# Patient Record
Sex: Female | Born: 1935 | Race: White | Hispanic: No | Smoking: Never smoker
Health system: Southern US, Community
[De-identification: ages and names within clinical notes are randomized; demographics above are authoritative.]

## PROBLEM LIST (undated history)

## (undated) DIAGNOSIS — I4891 Unspecified atrial fibrillation: Secondary | ICD-10-CM

---

## 2016-02-23 ENCOUNTER — Emergency Department (HOSPITAL_BASED_OUTPATIENT_CLINIC_OR_DEPARTMENT_OTHER)
Admission: EM | Admit: 2016-02-23 | Discharge: 2016-02-23 | Disposition: A | Discharge status: Home / Self Care | Payer: Medicare Other | Attending: Emergency Medicine | Admitting: Emergency Medicine

## 2016-02-23 ENCOUNTER — Encounter (HOSPITAL_BASED_OUTPATIENT_CLINIC_OR_DEPARTMENT_OTHER): Payer: Self-pay | Admitting: *Deleted

## 2016-02-23 ENCOUNTER — Emergency Department (HOSPITAL_BASED_OUTPATIENT_CLINIC_OR_DEPARTMENT_OTHER): Payer: Medicare Other

## 2016-02-23 DIAGNOSIS — Y9389 Activity, other specified: Secondary | ICD-10-CM | POA: Diagnosis not present

## 2016-02-23 DIAGNOSIS — Z880 Allergy status to penicillin: Secondary | ICD-10-CM | POA: Insufficient documentation

## 2016-02-23 DIAGNOSIS — Z79899 Other long term (current) drug therapy: Secondary | ICD-10-CM | POA: Diagnosis not present

## 2016-02-23 DIAGNOSIS — W1839XA Other fall on same level, initial encounter: Secondary | ICD-10-CM | POA: Insufficient documentation

## 2016-02-23 DIAGNOSIS — Y92129 Unspecified place in nursing home as the place of occurrence of the external cause: Secondary | ICD-10-CM | POA: Diagnosis not present

## 2016-02-23 DIAGNOSIS — I4891 Unspecified atrial fibrillation: Secondary | ICD-10-CM | POA: Diagnosis not present

## 2016-02-23 DIAGNOSIS — G454 Transient global amnesia: Secondary | ICD-10-CM | POA: Insufficient documentation

## 2016-02-23 DIAGNOSIS — Y998 Other external cause status: Secondary | ICD-10-CM | POA: Insufficient documentation

## 2016-02-23 DIAGNOSIS — S6991XA Unspecified injury of right wrist, hand and finger(s), initial encounter: Secondary | ICD-10-CM | POA: Diagnosis not present

## 2016-02-23 DIAGNOSIS — R4182 Altered mental status, unspecified: Secondary | ICD-10-CM | POA: Diagnosis present

## 2016-02-23 HISTORY — DX: Unspecified atrial fibrillation: I48.91

## 2016-02-23 LAB — BASIC METABOLIC PANEL
Anion gap: 8 (ref 5–15)
BUN: 24 mg/dL — AB (ref 6–20)
CALCIUM: 9.2 mg/dL (ref 8.9–10.3)
CHLORIDE: 100 mmol/L — AB (ref 101–111)
CO2: 26 mmol/L (ref 22–32)
CREATININE: 0.84 mg/dL (ref 0.44–1.00)
Glucose, Bld: 107 mg/dL — ABNORMAL HIGH (ref 65–99)
Potassium: 4.1 mmol/L (ref 3.5–5.1)
SODIUM: 134 mmol/L — AB (ref 135–145)

## 2016-02-23 LAB — CBC WITH DIFFERENTIAL/PLATELET
BASOS PCT: 1 %
Basophils Absolute: 0 10*3/uL (ref 0.0–0.1)
EOS ABS: 0.1 10*3/uL (ref 0.0–0.7)
EOS PCT: 1 %
HCT: 39.3 % (ref 36.0–46.0)
HEMOGLOBIN: 13.9 g/dL (ref 12.0–15.0)
LYMPHS ABS: 1 10*3/uL (ref 0.7–4.0)
Lymphocytes Relative: 19 %
MCH: 31.4 pg (ref 26.0–34.0)
MCHC: 35.4 g/dL (ref 30.0–36.0)
MCV: 88.7 fL (ref 78.0–100.0)
MONOS PCT: 12 %
Monocytes Absolute: 0.6 10*3/uL (ref 0.1–1.0)
NEUTROS PCT: 67 %
Neutro Abs: 3.5 10*3/uL (ref 1.7–7.7)
PLATELETS: 189 10*3/uL (ref 150–400)
RBC: 4.43 MIL/uL (ref 3.87–5.11)
RDW: 13 % (ref 11.5–15.5)
WBC: 5.2 10*3/uL (ref 4.0–10.5)

## 2016-02-23 LAB — URINALYSIS, ROUTINE W REFLEX MICROSCOPIC
BILIRUBIN URINE: NEGATIVE
GLUCOSE, UA: NEGATIVE mg/dL
HGB URINE DIPSTICK: NEGATIVE
KETONES UR: NEGATIVE mg/dL
Nitrite: NEGATIVE
PROTEIN: NEGATIVE mg/dL
Specific Gravity, Urine: 1.014 (ref 1.005–1.030)
pH: 7 (ref 5.0–8.0)

## 2016-02-23 LAB — URINE MICROSCOPIC-ADD ON

## 2016-02-23 LAB — TROPONIN I

## 2016-02-23 LAB — CBG MONITORING, ED: Glucose-Capillary: 108 mg/dL — ABNORMAL HIGH (ref 65–99)

## 2016-02-23 MED ORDER — ACETAMINOPHEN 325 MG PO TABS
650.0000 mg | ORAL_TABLET | Freq: Once | ORAL | Status: AC
  Administered 2016-02-23: 650 mg via ORAL
  Filled 2016-02-23: qty 2

## 2016-02-23 NOTE — Discharge Instructions (Signed)
This is a temporary, and improving condition.  You should not have any worsening or progression of symptoms at home.  If you do, return to the emergency room for reevaluation.   Transient Global Amnesia Transient global amnesia causes a sudden and temporary (transient) loss of memory (amnesia). While you may recall memories from your distant past, including being able to recognize people you know well, you may not recall things that happened more recently in the past days, months, or even year. A transient global amnesia episode does not last longer than 24 hours.  Transient global amnesia does not affect your other brain functions. Your memory usually returns to normal after an episode is over. One episode of transient global amnesia does not make you more likely to have a stroke, a relapse, or other complications.  CAUSES  The cause of this condition is not known.  RISK FACTORS  Transient global amnesia is more likely to develop in people who:  Are 6650-80 years old.  Have a history of migraine headaches. SYMPTOMS  The main symptoms of this condition include:  The inability to remember recent events.  Asking repetitive questions about the situation and surroundings and not recalling the answers to these questions. Other symptoms include:  Restlessness and nervousness.  Confusion.  Headaches.  Dizziness.  Nausea. DIAGNOSIS  Your health care provider may suspect transient global amnesia based on your symptoms. Your health care provider will do a physical exam. This may include a test to check your mental abilities (cognitive evaluation). You may also have imaging studies done to check your brain function. These may include:   Electroencephalography (EEG).  Diffusion-weighted imaging (DWI).  MRI. TREATMENT  There is no treatment for this condition. An episode typically goes away on its own after a few hours. If you also have a seizure or migraine during an episode, you will  receive treatment for these conditions. This may include medicines. HOME CARE INSTRUCTIONS  Take medicines only as directed by your health care provider.  Tell your family or friends that you have transient global amnesia. Ask them to help you avoid physical exertion, including sexual intercourse, swimming, and straining while holding your breath (Valsalva maneuver), until the episode passes. These are events that can bring on transient global amnesia attacks. SEEK MEDICAL CARE IF:   You have a migraine and it does not go away after you have followed your treatment plan for this condition.  You have a seizure for the first time, or a seizure that is different from seizures you normally have.  You experience transient global amnesia repeatedly.   This information is not intended to replace advice given to you by your health care provider. Make sure you discuss any questions you have with your health care provider.   Document Released: 11/28/2004 Document Revised: 07/12/2015 Document Reviewed: 07/06/2014 Elsevier Interactive Patient Education [employer]2016 [employer]vier Inc.

## 2016-02-23 NOTE — ED Provider Notes (Addendum)
CSN: [id number]     Arrival date & time 02/23/16  1659 History   First MD Initiated Contact with Patient 02/23/16 1704     Chief Complaint  Patient presents with  . Fall  . Altered Mental Status     HPI  Patient presents evaluation after a fall and memory difficulties. Patient resides at an assisted living facility in town. She is at [facility] on the independent side.  She apparently drove her car to a barbecue place today. While there she had a fall. She complains of wrist pain. Upon arrival of paramedics she cannot describe to them how she had gotten there where her car was, or any details about her fall.  She is able to ambulate. She was awake alert and lucid. Transferred here.  Past Medical History  Diagnosis Date  . Atrial fibrillation (HCC)    History reviewed. No pertinent past surgical history. History reviewed. No pertinent family history. Social History  Substance Use Topics  . Smoking status: Never Smoker   . Smokeless tobacco: None  . Alcohol Use: No   OB History    No data available     Review of Systems  Constitutional: Negative for fever, chills, diaphoresis, appetite change and fatigue.  HENT: Negative for mouth sores, sore throat and trouble swallowing.   Eyes: Negative for visual disturbance.  Respiratory: Negative for cough, chest tightness, shortness of breath and wheezing.   Cardiovascular: Negative for chest pain.  Gastrointestinal: Negative for nausea, vomiting, abdominal pain, diarrhea and abdominal distention.  Endocrine: Negative for polydipsia, polyphagia and polyuria.  Genitourinary: Negative for dysuria, frequency and hematuria.  Musculoskeletal: Negative for gait problem.       Right wrist pain  Skin: Negative for color change, pallor and rash.  Neurological: Negative for dizziness, syncope, light-headedness and headaches.  Hematological: Does not bruise/bleed easily.  Psychiatric/Behavioral: Negative for behavioral problems and  confusion.      Allergies  Codeine and Penicillins  Home Medications   Prior to Admission medications   Medication Sig Start Date End Date Taking? Authorizing Provider  metoprolol tartrate (LOPRESSOR) 25 MG tablet Take 25 mg by mouth 2 (two) times daily.   Yes Historical Provider, MD  simvastatin (ZOCOR) 20 MG tablet Take 20 mg by mouth daily.   Yes Historical Provider, MD   BP 134/86 mmHg  Pulse 65  Temp(Src) 98.7 F (37.1 C) (Oral)  Resp 17  Ht  (1.6 m)  SpO2 98% Physical Exam  Constitutional: She is oriented to person, place, and time. She appears well-developed and well-nourished. No distress.  HENT:  Head: Normocephalic.  Eyes: Conjunctivae are normal. Pupils are equal, round, and reactive to light. No scleral icterus.  Neck: Normal range of motion. Neck supple. No thyromegaly present.  Cardiovascular: Normal rate and regular rhythm.  Exam reveals no gallop and no friction rub.   No murmur heard. Pulmonary/Chest: Effort normal and breath sounds normal. No respiratory distress. She has no wheezes. She has no rales.  Abdominal: Soft. Bowel sounds are normal. She exhibits no distension. There is no tenderness. There is no rebound.  Musculoskeletal: Normal range of motion.  Neurological: She is alert and oriented to person, place, and time.  Sanders are intact and symmetric. She has normal peripheral strength exam without leg or pronator drift. Normal gait. Normal sensation. Normal cerebellar function. Her memory and recall for events prior to arrival to emergency room, and from this morning are absent. She can recall the address of her  facility, her children's names and locations area  Skin: Skin is warm and dry. No rash noted.  Psychiatric: She has a normal mood and affect. Her behavior is normal.    ED Course  Procedures (including critical care time) Labs Review Labs Reviewed  BASIC METABOLIC PANEL - Abnormal; Notable for the following:    Sodium 134 (*)     Chloride 100 (*)    Glucose, Bld 107 (*)    BUN 24 (*)    All other components within normal limits  URINALYSIS, ROUTINE W REFLEX MICROSCOPIC (NOT AT [facility]) - Abnormal; Notable for the following:    Leukocytes, UA TRACE (*)    All other components within normal limits  URINE MICROSCOPIC-ADD ON - Abnormal; Notable for the following:    Squamous Epithelial / LPF 0-5 (*)    Bacteria, UA RARE (*)    All other components within normal limits  CBG MONITORING, ED - Abnormal; Notable for the following:    Glucose-Capillary 108 (*)    All other components within normal limits  CBC WITH DIFFERENTIAL/PLATELET  TROPONIN I    Imaging Review Dg Wrist Complete Right  02/23/2016  CLINICAL DATA:  Fall.  Right wrist pain and swelling. EXAM: RIGHT WRIST - COMPLETE 3+ VIEW COMPARISON:  None. FINDINGS: Osteoarthritis at the first carpometacarpal articulation. Bony demineralization. Bony ridging of the distal radial metaphysis but without a well-defined fracture. Soft tissue swelling lateral to the distal radial metaphysis. Pronator fat pad does not appear displaced. IMPRESSION: 1. Degenerative findings at the first carpometacarpal articulation. Although there is soft tissue swelling laterally along with some bony ridging along the distal radial metaphysis, no definite cortical discontinuity is identified to further favor fracture. If there is a high clinical suspicion of occult fracture, CT or MRI may be utilized. Electronically Signed   By: Gaylyn RongWalter  Liebkemann M.D.   On: 02/23/2016 18:37   Ct Head Wo Contrast  02/23/2016  CLINICAL DATA:  [facility] and [facility], not sure if hit her head, confusion present since event.the patient unable to recall event EXAM: CT HEAD WITHOUT CONTRAST TECHNIQUE: Contiguous axial images were obtained from the base of the skull through the vertex without intravenous contrast. COMPARISON:  None. FINDINGS: There is significant central cortical atrophy. Periventricular  white matter changes are consistent with small vessel disease. 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 There is no intra or extra-axial fluid collection or mass lesion. The basilar cisterns and ventricles have a normal appearance. There is no CT evidence for acute infarction or hemorrhage. There is no calvarial fracture. There is opacification of scattered paranasal sinuses. Note is made of small subdermal scalp nodules, some of which are calcified. IMPRESSION: 1. Atrophy and small vessel disease. 2.  No evidence for acute intracranial abnormality. Electronically Signed   By: Norva PavlovElizabeth  Brown M.D.   On: 02/23/2016 18:14   I have personally reviewed and evaluated these images and lab results as part of my medical decision-making.   EKG Interpretation None      MDM   Final diagnoses:  Transient global amnesia    Just after my initial evaluation, I discussed the case with neurology on call Dr. Lavon PaganiniNandigam. He felt that the patient had any persistence of symptoms, that patient could be considered for admission for MRI to rule out temporal lobe stroke. On my reevaluation the patient is awake and alert oriented lucid and has full recollection of her day including driving to  the restaurant ordering food up into the time of her fall. Didn't can describe the arrival of paramedics and her transport here. I offered admission. She politely declines. I think this is not reasonable. She is anticoagulated with Eliquis for her Atrial fibrillation. This was not reported by her care facility. She assures me that she does take Eliquis, and spells ELOQUIS for me.. Takes metoprolol for rate control.  I discussed with her that this is a transient condition that only rapidly improves if she has any worsening or progression of symptoms and that that should prompt reevaluation the emergency room. She especially understanding.    Rolland Porter, MD 02/23/16 1943  Rolland Porter, MD 02/23/16 1944

## 2016-02-23 NOTE — ED Notes (Signed)
[facility], not sure if hit her head, confusion present since event. Able to know her name, not date, time, location, or current president. EMS called her daughter and was told pt has history of afib and confusion in the past other wise independent. [facility] [place] [facility] ind[facility] side. Misty StanleyLisa (dtg) [phone].

## 2016-02-23 NOTE — ED Notes (Signed)
Pt's daughter in law called and updated on current information. Their phone number [phone].

## 2016-02-23 NOTE — ED Notes (Signed)
Called pt's daughter Misty Stanleylisa to update her on the pt's current condition.

## 2016-02-23 NOTE — ED Notes (Signed)
MD at bedside. 

## 2016-02-23 NOTE — ED Notes (Signed)
At bedside with patient. Pt able to recall why she went to the [place], state time, date, place and current [place] president. Pt recalls driving BBQ her plan was to drive to [place] to see older sister.

## 2017-08-11 IMAGING — CT CT HEAD W/O CM
1 series · 16 of 30 positions shown, 20 images · non-contrast
Comparison: None.

CLINICAL DATA: Per EMS was at [employer] restaurant and fell out, not sure
if hit her head, confusion present since event....the patient unable
to recall event

EXAM:
CT HEAD WITHOUT CONTRAST
TECHNIQUE: Contiguous axial images were obtained from the base of the skull
through the vertex without intravenous contrast.

[Series 2: head wo · axial · 0.40mm/px · z∈[-143,-13]mm · 16 of 30 slices shown, 20 images]
[im 2/30  brain]
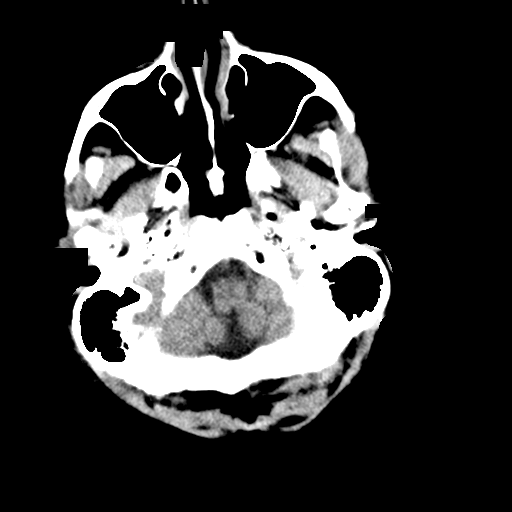
[im 2/30  bone]
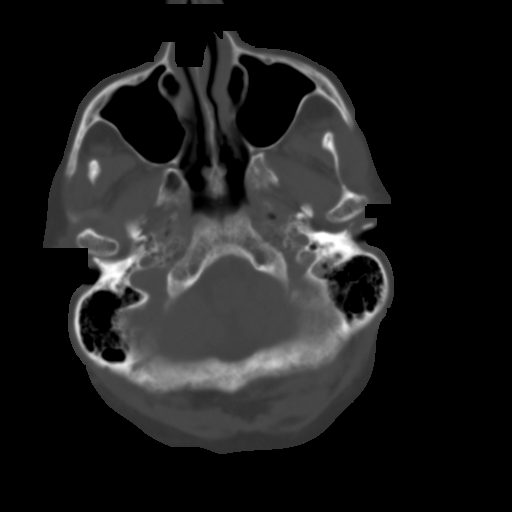
[im 4/30  brain]
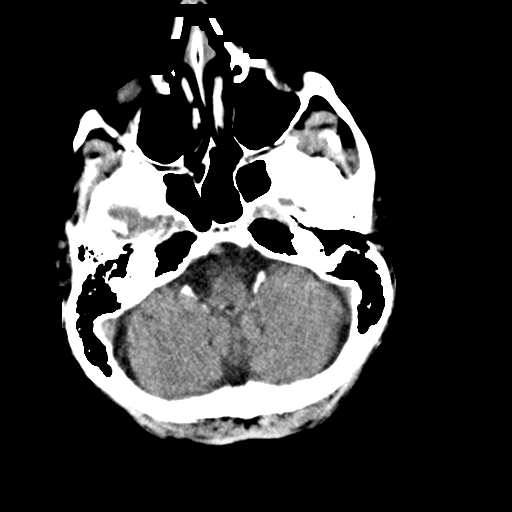
[im 6/30  brain]
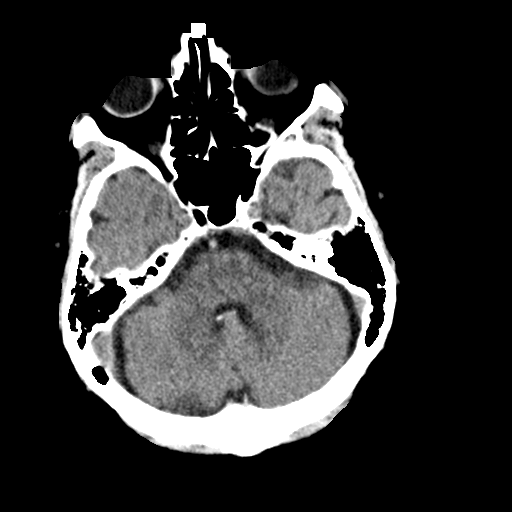
[im 8/30  brain]
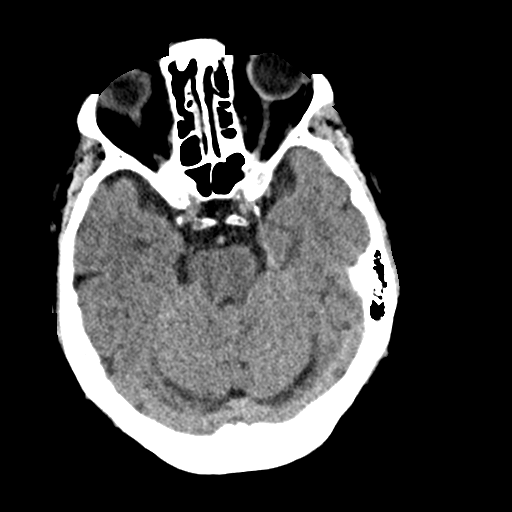
[im 9/30  brain]
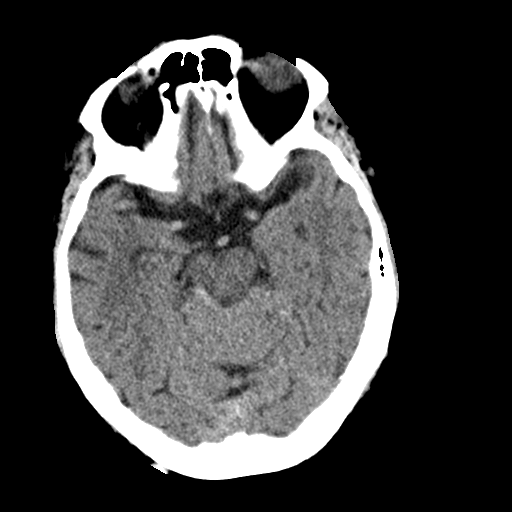
[im 9/30  bone]
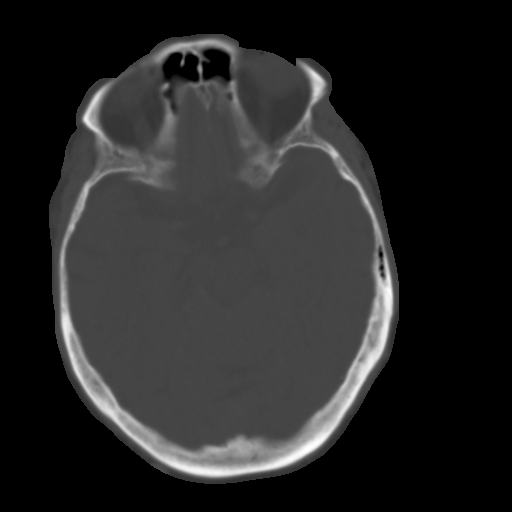
[im 11/30  brain]
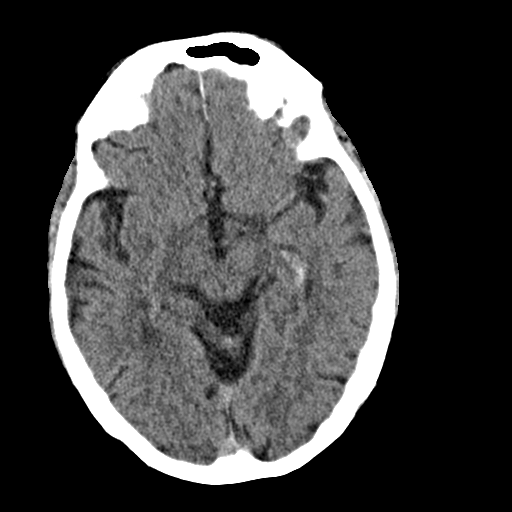
[im 13/30  brain]
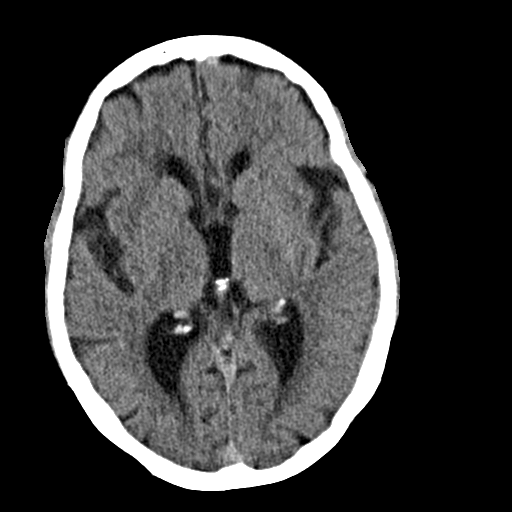
[im 15/30  brain]
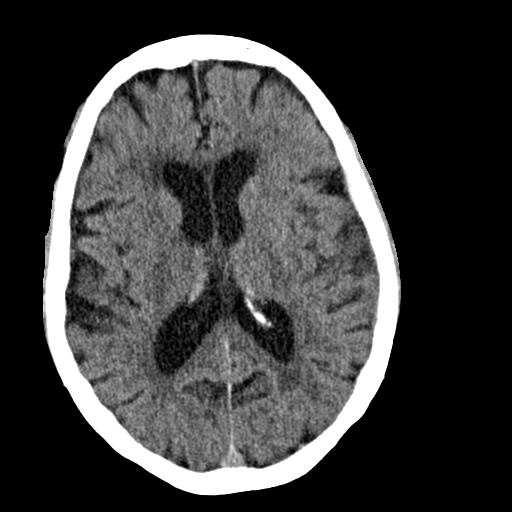
[im 16/30  brain]
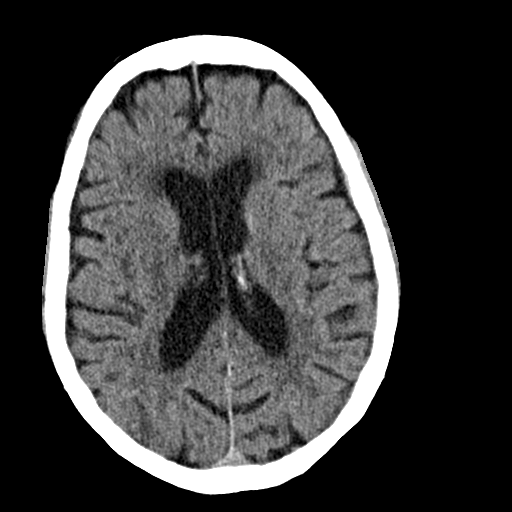
[im 16/30  bone]
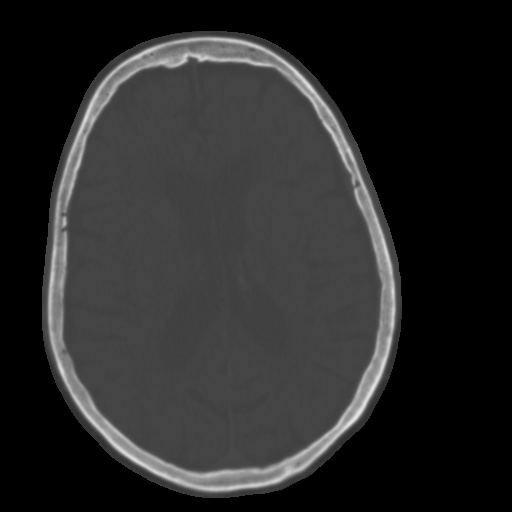
[im 18/30  brain]
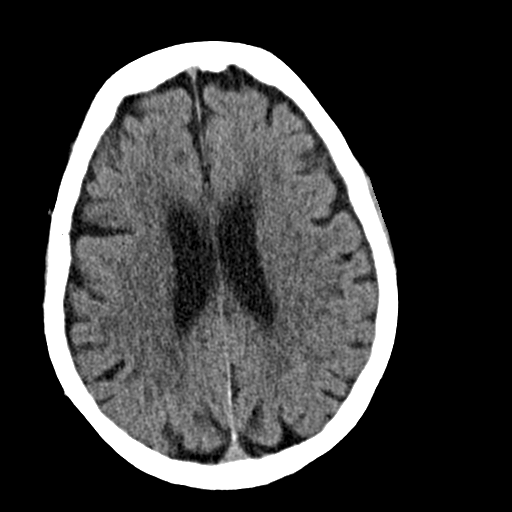
[im 20/30  brain]
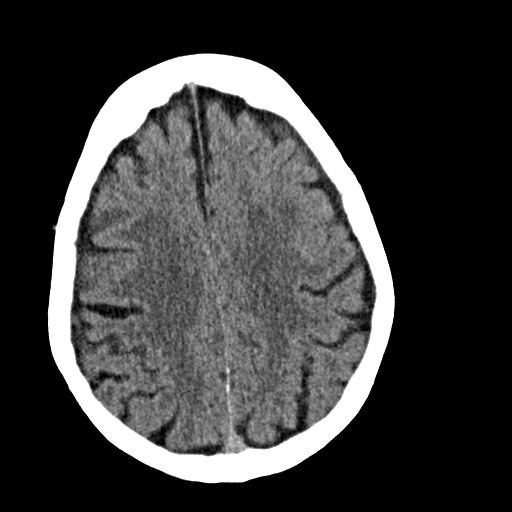
[im 22/30  brain]
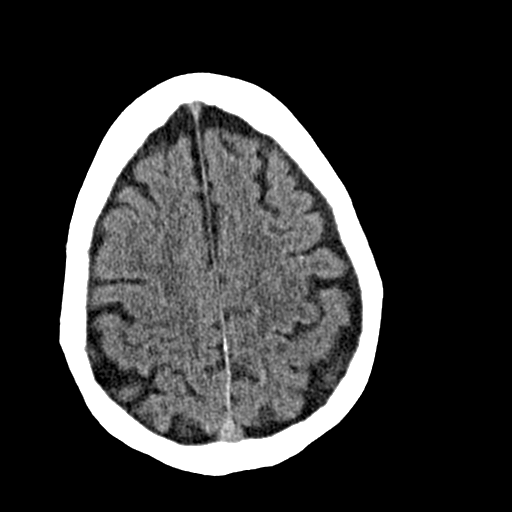
[im 23/30  brain]
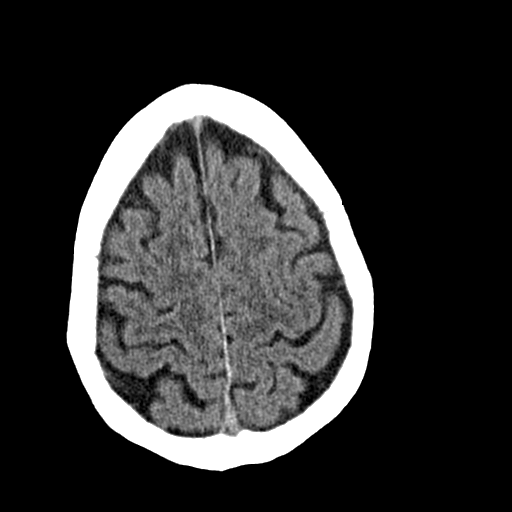
[im 23/30  bone]
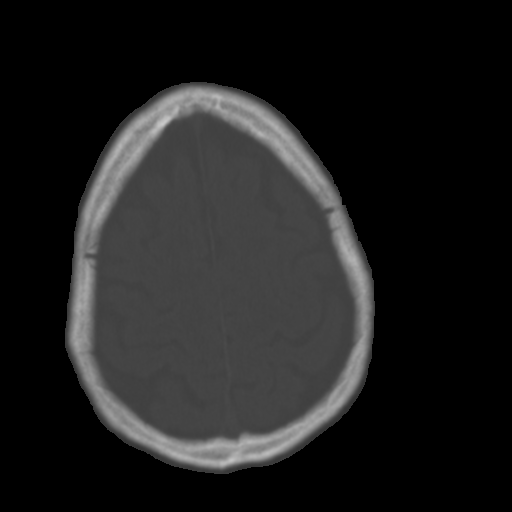
[im 25/30  brain]
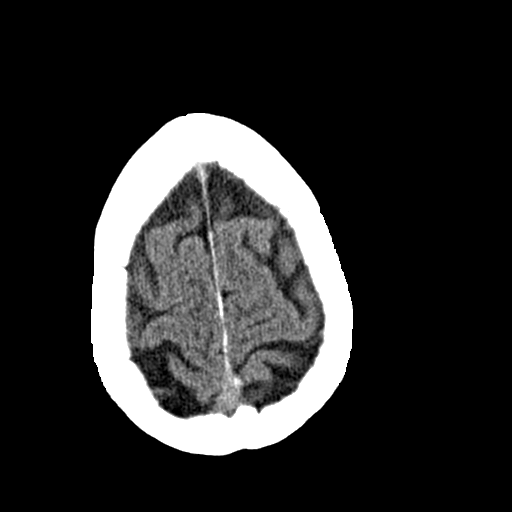
[im 27/30  brain]
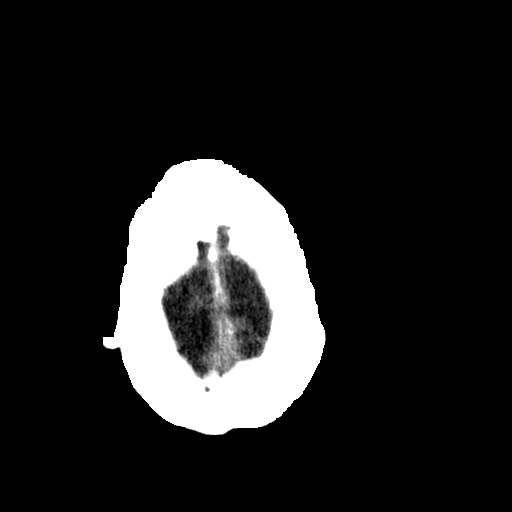
[im 29/30  brain]
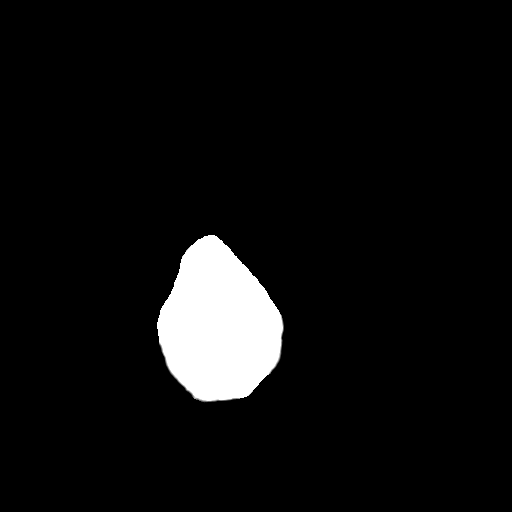

[16 of 30 positions shown; findings below may reference images not displayed]

FINDINGS: There is significant central cortical atrophy. Periventricular white
matter changes are consistent with small vessel disease. There is a
remote lacunar infarct of the right cerebellar hemisphere. There is
no intra or extra-axial fluid collection or mass lesion. The basilar
cisterns and ventricles have a normal appearance. There is no CT
evidence for acute infarction or hemorrhage.

There is no calvarial fracture. There is opacification of scattered
paranasal sinuses. Note is made of small subdermal scalp nodules,
some of which are calcified.
IMPRESSION: 1. Atrophy and small vessel disease.
2.  No evidence for acute intracranial abnormality.
